# Patient Record
Sex: Male | Born: 1985 | Race: White | Hispanic: No | Smoking: Never smoker
Health system: Southern US, Community
[De-identification: ages and names within clinical notes are randomized; demographics above are authoritative.]

---

## 2016-09-13 ENCOUNTER — Emergency Department (HOSPITAL_COMMUNITY)
Admission: EM | Admit: 2016-09-13 | Discharge: 2016-09-13 | Disposition: A | Discharge status: Home / Self Care | Attending: Emergency Medicine | Admitting: Emergency Medicine

## 2016-09-13 ENCOUNTER — Encounter (HOSPITAL_COMMUNITY): Payer: Self-pay

## 2016-09-13 ENCOUNTER — Emergency Department (HOSPITAL_COMMUNITY)

## 2016-09-13 DIAGNOSIS — Z79899 Other long term (current) drug therapy: Secondary | ICD-10-CM | POA: Diagnosis not present

## 2016-09-13 DIAGNOSIS — R0789 Other chest pain: Secondary | ICD-10-CM | POA: Insufficient documentation

## 2016-09-13 DIAGNOSIS — R079 Chest pain, unspecified: Secondary | ICD-10-CM | POA: Diagnosis present

## 2016-09-13 LAB — BASIC METABOLIC PANEL
Anion gap: 7 (ref 5–15)
BUN: 11 mg/dL (ref 6–20)
CALCIUM: 9 mg/dL (ref 8.9–10.3)
CO2: 24 mmol/L (ref 22–32)
CREATININE: 1.25 mg/dL — AB (ref 0.61–1.24)
Chloride: 103 mmol/L (ref 101–111)
GFR calc Af Amer: >60 mL/min (ref >60)
GFR calc non Af Amer: >60 mL/min (ref >60)
Glucose, Bld: 91 mg/dL (ref 65–99)
Potassium: 3.6 mmol/L (ref 3.5–5.1)
SODIUM: 134 mmol/L — AB (ref 135–145)

## 2016-09-13 LAB — CBC
HCT: 43 % (ref 39.0–52.0)
Hemoglobin: 15.2 g/dL (ref 13.0–17.0)
MCH: 31.6 pg (ref 26.0–34.0)
MCHC: 35.3 g/dL (ref 30.0–36.0)
MCV: 89.4 fL (ref 78.0–100.0)
PLATELETS: 180 10*3/uL (ref 150–400)
RBC: 4.81 MIL/uL (ref 4.22–5.81)
RDW: 12.4 % (ref 11.5–15.5)
WBC: 5.7 10*3/uL (ref 4.0–10.5)

## 2016-09-13 LAB — I-STAT TROPONIN, ED
TROPONIN I, POC: 0 ng/mL (ref 0.00–0.08)
Troponin i, poc: 0 ng/mL (ref 0.00–0.08)

## 2016-09-13 LAB — D-DIMER, QUANTITATIVE: D-Dimer, Quant: <0.27 ug/mL-FEU (ref 0.00–0.50)

## 2016-09-13 LAB — LIPASE, BLOOD: Lipase: 23 U/L (ref 11–51)

## 2016-09-13 MED ORDER — GI COCKTAIL ~~LOC~~
30.0000 mL | Freq: Once | ORAL | Status: AC
  Administered 2016-09-13: 30 mL via ORAL
  Filled 2016-09-13: qty 30

## 2016-09-13 MED ORDER — KETOROLAC TROMETHAMINE 30 MG/ML IJ SOLN
30.0000 mg | Freq: Once | INTRAMUSCULAR | Status: AC
  Administered 2016-09-13: 30 mg via INTRAVENOUS
  Filled 2016-09-13: qty 1

## 2016-09-13 MED ORDER — MORPHINE SULFATE (PF) 4 MG/ML IV SOLN
4.0000 mg | Freq: Once | INTRAVENOUS | Status: DC
Start: 2016-09-13 — End: 2016-09-13
  Filled 2016-09-13: qty 1

## 2016-09-13 MED ORDER — ONDANSETRON HCL 4 MG/2ML IJ SOLN
4.0000 mg | Freq: Once | INTRAMUSCULAR | Status: DC
  Filled 2016-09-13: qty 2

## 2016-09-13 NOTE — ED Notes (Signed)
Patient transported to X-ray 

## 2016-09-13 NOTE — ED Provider Notes (Signed)
[facility]-EMERGENCY DEPT Provider Note   CSN: [id number] Arrival date & time: 09/13/16  1429     History   Chief Complaint Chief Complaint  Patient presents with  . Chest Pain    HPI Rodney Anderson is a 31 y.o. male.  Pt presents to the ED today with CP.  He said sx started around 1330.  He said it feels like there is something squeezing his heart.  He has never had anything like this in the past.  He was at work, but was not doing anything strenuous.  Pt was given asa by EMS.  He denies sob, f/c.      History reviewed. No pertinent past medical history.  There are no active problems to display for this patient.   History reviewed. No pertinent surgical history.     Home Medications    Prior to Admission medications   Medication Sig Start Date End Date Taking? Authorizing Provider  fexofenadine (ALLEGRA) 180 MG tablet Take 180 mg by mouth daily.   Yes [provider]  Multiple Vitamin (MULTIVITAMIN WITH MINERALS) TABS tablet Take 1 tablet by mouth daily.   Yes [provider]    Family History No family history on file.  Social History Social History  Substance Use Topics  . Smoking status: Never Smoker  . Smokeless tobacco: Never Used  . Alcohol use No     Allergies   Patient has no known allergies.   Review of Systems Review of Systems  Cardiovascular: Positive for chest pain.  All other systems reviewed and are negative.    Physical Exam Updated Vital Signs BP 128/76   Pulse (!) 52   Temp 99.7 F (37.6 C) (Oral)   Resp 13   Ht 5\' 11"  (1.803 m)   Wt 87.1 kg (192 lb)   SpO2 97%   BMI 26.78 kg/m   Physical Exam  Constitutional: He is oriented to person, place, and time. He appears well-developed and well-nourished.  HENT:  Head: Normocephalic and atraumatic.  Right Ear: External ear normal.  Left Ear: External ear normal.  Nose: Nose normal.  Mouth/Throat: Oropharynx is clear and moist.  Eyes: Conjunctivae and EOM are  normal. Pupils are equal, round, and reactive to light.  Neck: Normal range of motion. Neck supple.  Cardiovascular: Normal rate, regular rhythm, normal heart sounds and intact distal pulses.   Pulmonary/Chest: Effort normal and breath sounds normal.  Abdominal: Soft. Bowel sounds are normal.  Musculoskeletal: Normal range of motion.  Neurological: He is alert and oriented to person, place, and time.  Skin: Skin is warm.  Psychiatric: He has a normal mood and affect. His behavior is normal. Judgment and thought content normal.  Nursing note and vitals reviewed.    ED Treatments / Results  Labs (all labs ordered are listed, but only abnormal results are displayed) Labs Reviewed  BASIC METABOLIC PANEL - Abnormal; Notable for the following:       Result Value   Sodium 134 (*)    Creatinine, Ser 1.25 (*)    All other components within normal limits  CBC  D-DIMER, QUANTITATIVE (NOT AT [facility])  LIPASE, BLOOD  I-STAT TROPOININ, ED  I-STAT TROPOININ, ED    EKG  EKG Interpretation  Date/Time:  Sunday September 13 2016 14:34:06 EDT Ventricular Rate:  82 PR Interval:    QRS Duration: 94 QT Interval:  380 QTC Calculation: 444 R Axis:   84 Text Interpretation:  Sinus rhythm RSR' in V1 or V2, probably  normal variant Confirmed by Jacalyn Lefevre [phone]) on 09/13/2016 3:06:08 PM       Radiology Dg Chest 2 View  Result Date: 09/13/2016 CLINICAL DATA:  Patient with dizziness and centralized chest pain, worsening with deep inspiration. EXAM: CHEST  2 VIEW COMPARISON:  None. FINDINGS: The heart size and mediastinal contours are within normal limits. Both lungs are clear. The visualized skeletal structures are unremarkable. IMPRESSION: No active cardiopulmonary disease. Electronically Signed   By: Annia Belt M.D.   On: 09/13/2016 15:27    Procedures Procedures (including critical care time)  Medications Ordered in ED Medications  morphine 4 MG/ML injection 4 mg (0 mg Intravenous Hold 09/13/16  1541)  ondansetron (ZOFRAN) injection 4 mg (0 mg Intravenous Hold 09/13/16 1542)  ketorolac (TORADOL) 30 MG/ML injection 30 mg (30 mg Intravenous Given 09/13/16 1626)  gi cocktail (Maalox,Lidocaine,Donnatal) (30 mLs Oral Given 09/13/16 1722)     Initial Impression / Assessment and Plan / ED Course  I have reviewed the triage vital signs and the nursing notes.  Pertinent labs & imaging results that were available during my care of the patient were reviewed by me and considered in my medical decision making (see chart for details).    Pain is atypical with low risk of cad.  No other source of etiology for cp.  Pt stable for d/c.  He is instructed to f/u with pcp when he gets home.  Return if worse.  Final Clinical Impressions(s) / ED Diagnoses   Final diagnoses:  Atypical chest pain    New Prescriptions New Prescriptions   No medications on file     Jacalyn Lefevre, MD 09/13/16 1851

## 2016-09-13 NOTE — ED Triage Notes (Signed)
Pt from work with sub-sternal chest pain. Pt started having pain shortly after eating lunch. Pt rates 6-10. Pt given 324mg  ASA

## 2018-03-13 IMAGING — DX DG CHEST 2V
2 series · 2 of 2 positions shown · non-contrast
Comparison: None.

CLINICAL DATA: Patient with dizziness and centralized chest pain,
worsening with deep inspiration.

EXAM:
CHEST  2 VIEW

[chest pa]
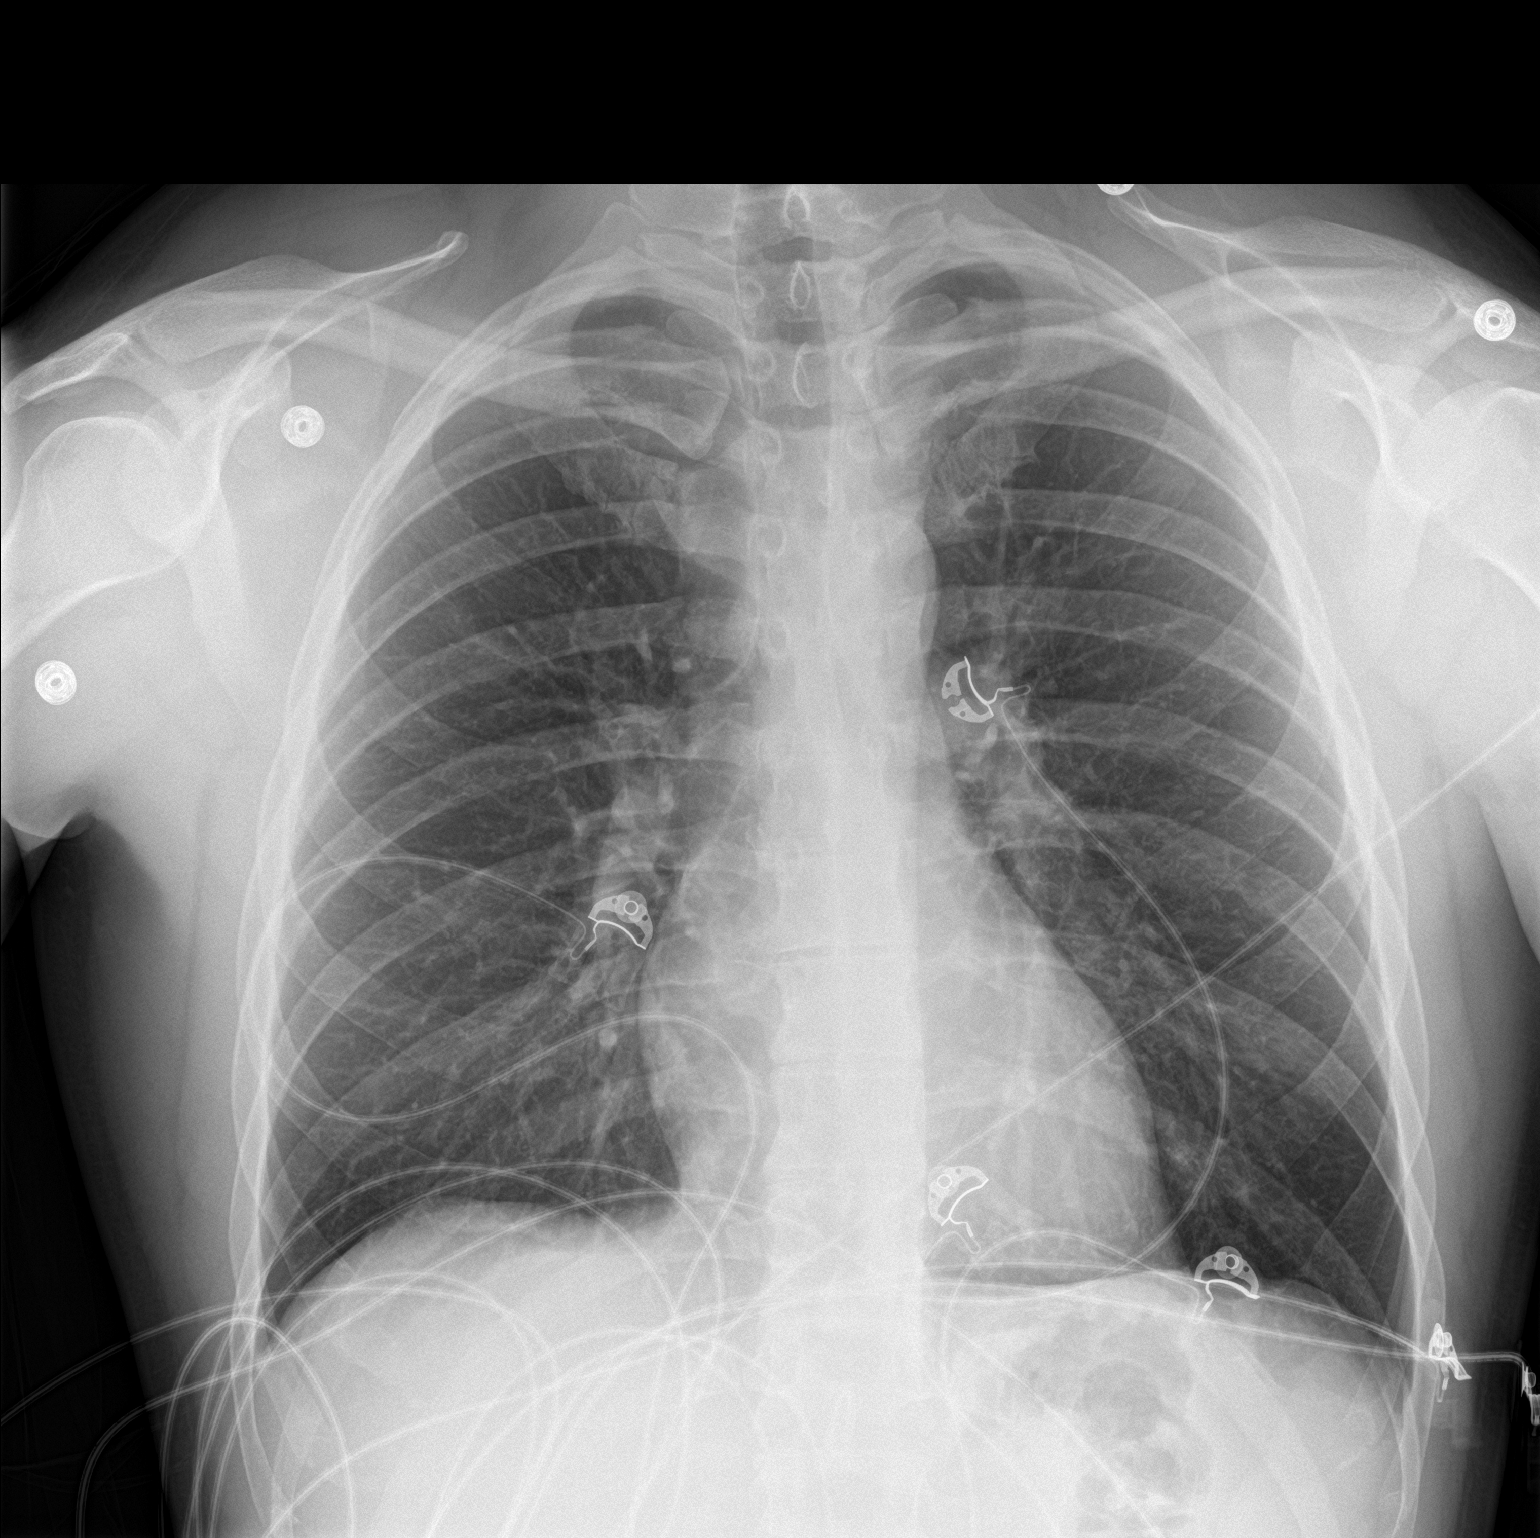

[chest lat]
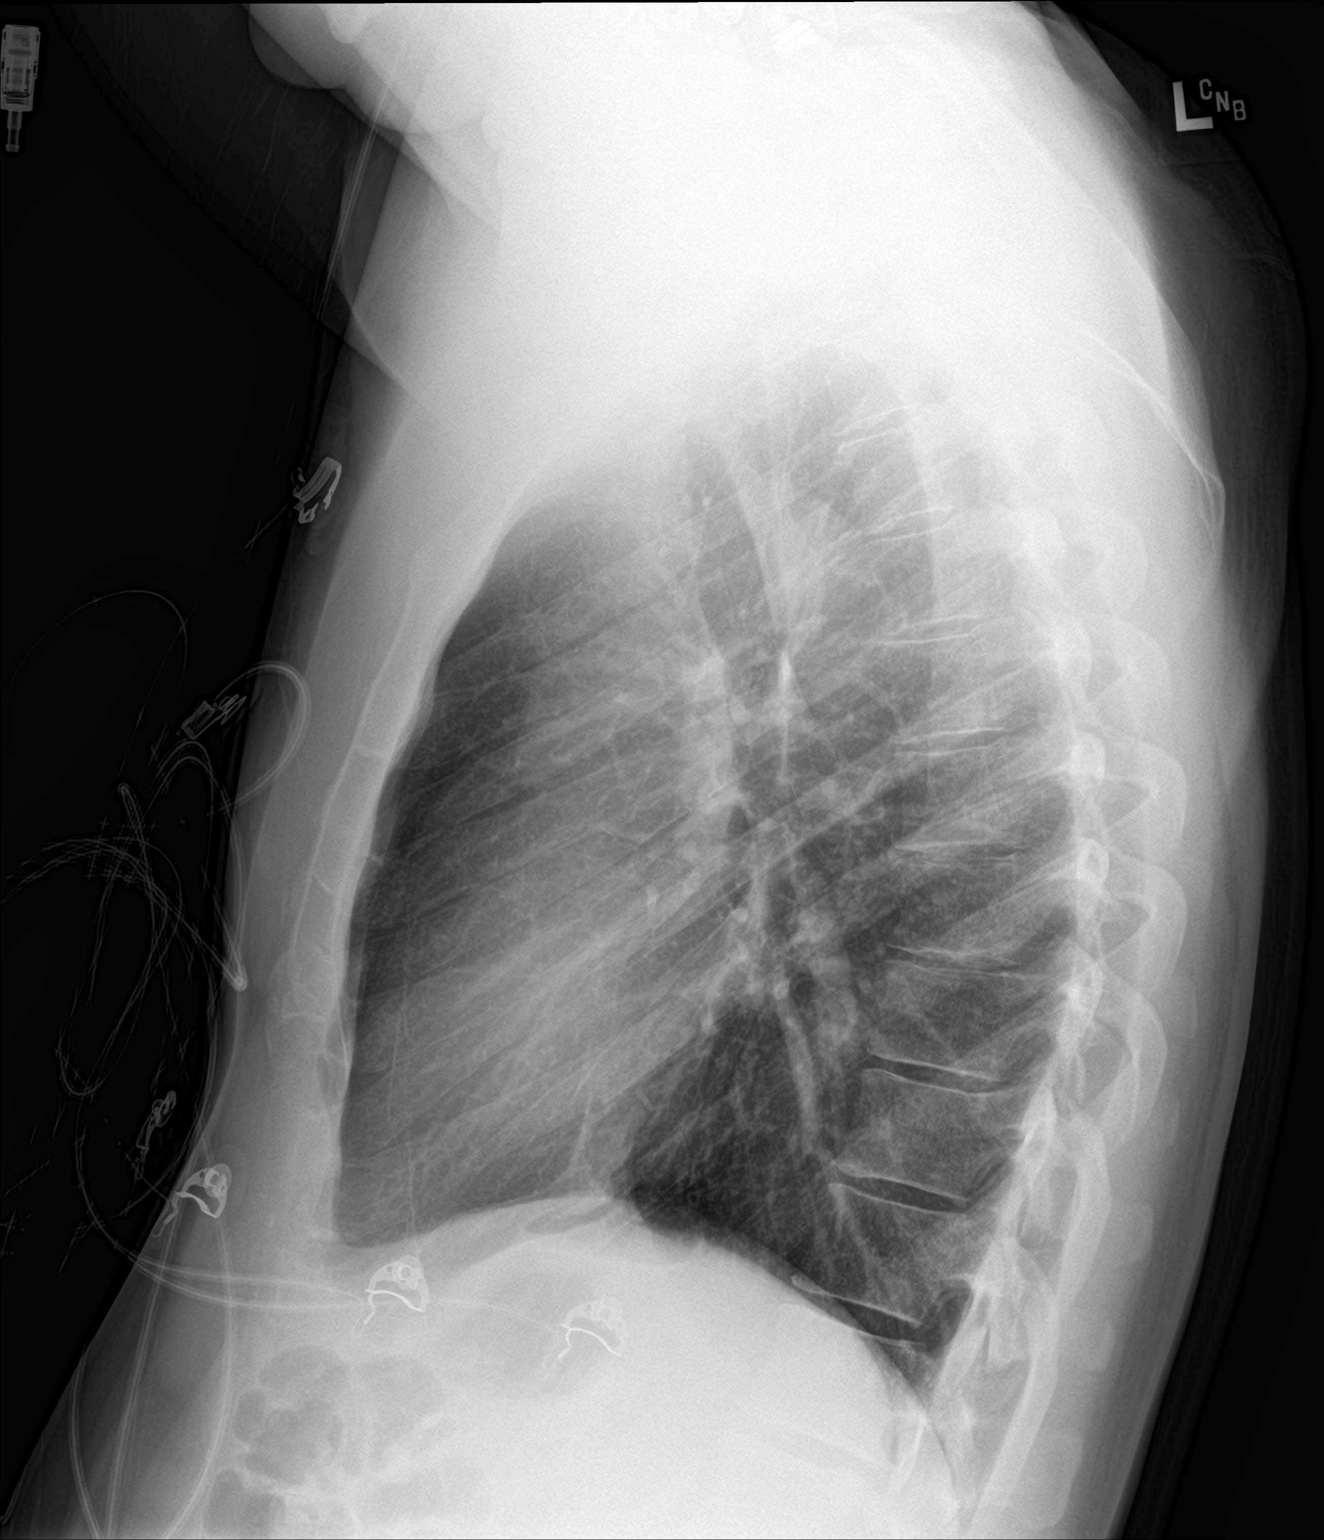

[2 of 2 positions shown; findings below may reference images not displayed]

FINDINGS: The heart size and mediastinal contours are within normal limits.
Both lungs are clear. The visualized skeletal structures are
unremarkable.
IMPRESSION: No active cardiopulmonary disease.
